# Patient Record
Sex: Male | Born: 1960 | Race: White | Hispanic: No | Smoking: Never smoker
Health system: Southern US, Community
[De-identification: ages and names within clinical notes are randomized; demographics above are authoritative.]

## PROBLEM LIST (undated history)

## (undated) DIAGNOSIS — N400 Enlarged prostate without lower urinary tract symptoms: Secondary | ICD-10-CM

## (undated) HISTORY — PX: HERNIA REPAIR: SHX51

## (undated) HISTORY — PX: APPENDECTOMY: SHX54

---

## 1998-08-08 ENCOUNTER — Encounter: Payer: Self-pay | Admitting: Family Medicine

## 1998-08-08 ENCOUNTER — Ambulatory Visit (HOSPITAL_COMMUNITY): Admission: RE | Admit: 1998-08-08 | Discharge: 1998-08-08 | Payer: Self-pay | Admitting: Family Medicine

## 1999-01-19 ENCOUNTER — Encounter: Payer: Self-pay | Admitting: Family Medicine

## 1999-01-19 ENCOUNTER — Ambulatory Visit (HOSPITAL_COMMUNITY): Admission: RE | Admit: 1999-01-19 | Discharge: 1999-01-19 | Payer: Self-pay | Admitting: Family Medicine

## 2011-11-01 ENCOUNTER — Other Ambulatory Visit: Payer: Self-pay | Admitting: Family Medicine

## 2011-11-01 DIAGNOSIS — R2231 Localized swelling, mass and lump, right upper limb: Secondary | ICD-10-CM

## 2011-11-06 ENCOUNTER — Ambulatory Visit
Admission: RE | Admit: 2011-11-06 | Discharge: 2011-11-06 | Disposition: A | Discharge status: Home / Self Care | Payer: Self-pay | Source: Ambulatory Visit | Attending: Family Medicine | Admitting: Family Medicine

## 2011-11-06 ENCOUNTER — Other Ambulatory Visit: Payer: Self-pay | Admitting: Family Medicine

## 2011-11-06 DIAGNOSIS — R2231 Localized swelling, mass and lump, right upper limb: Secondary | ICD-10-CM

## 2011-11-06 MED ORDER — GADOBENATE DIMEGLUMINE 529 MG/ML IV SOLN
20.0000 mL | Freq: Once | INTRAVENOUS | Status: AC | PRN
  Administered 2011-11-06: 20 mL via INTRAVENOUS

## 2012-10-31 ENCOUNTER — Other Ambulatory Visit: Payer: Self-pay | Admitting: Urology

## 2012-10-31 DIAGNOSIS — D400 Neoplasm of uncertain behavior of prostate: Secondary | ICD-10-CM

## 2012-12-01 ENCOUNTER — Ambulatory Visit (HOSPITAL_COMMUNITY)
Admission: RE | Admit: 2012-12-01 | Discharge: 2012-12-01 | Disposition: A | Discharge status: Home / Self Care | Payer: BC Managed Care – PPO | Source: Ambulatory Visit | Attending: Urology | Admitting: Urology

## 2012-12-01 ENCOUNTER — Other Ambulatory Visit: Payer: Self-pay | Admitting: Urology

## 2012-12-01 DIAGNOSIS — Z1389 Encounter for screening for other disorder: Secondary | ICD-10-CM

## 2012-12-01 DIAGNOSIS — D400 Neoplasm of uncertain behavior of prostate: Secondary | ICD-10-CM

## 2012-12-01 DIAGNOSIS — N400 Enlarged prostate without lower urinary tract symptoms: Secondary | ICD-10-CM | POA: Insufficient documentation

## 2012-12-01 DIAGNOSIS — R972 Elevated prostate specific antigen [PSA]: Secondary | ICD-10-CM | POA: Insufficient documentation

## 2012-12-01 MED ORDER — GADOBENATE DIMEGLUMINE 529 MG/ML IV SOLN
20.0000 mL | Freq: Once | INTRAVENOUS | Status: AC | PRN
  Administered 2012-12-01: 20 mL via INTRAVENOUS

## 2021-05-01 ENCOUNTER — Other Ambulatory Visit: Payer: Self-pay | Admitting: Urology

## 2021-05-01 DIAGNOSIS — R972 Elevated prostate specific antigen [PSA]: Secondary | ICD-10-CM

## 2021-05-10 ENCOUNTER — Emergency Department (HOSPITAL_BASED_OUTPATIENT_CLINIC_OR_DEPARTMENT_OTHER)
Admission: EM | Admit: 2021-05-10 | Discharge: 2021-05-10 | Disposition: A | Discharge status: Home / Self Care | Payer: Self-pay | Attending: Emergency Medicine | Admitting: Emergency Medicine

## 2021-05-10 ENCOUNTER — Encounter (HOSPITAL_BASED_OUTPATIENT_CLINIC_OR_DEPARTMENT_OTHER): Payer: Self-pay | Admitting: Emergency Medicine

## 2021-05-10 ENCOUNTER — Other Ambulatory Visit: Payer: Self-pay

## 2021-05-10 DIAGNOSIS — N390 Urinary tract infection, site not specified: Secondary | ICD-10-CM | POA: Insufficient documentation

## 2021-05-10 DIAGNOSIS — R339 Retention of urine, unspecified: Secondary | ICD-10-CM

## 2021-05-10 HISTORY — DX: Benign prostatic hyperplasia without lower urinary tract symptoms: N40.0

## 2021-05-10 LAB — URINALYSIS, ROUTINE W REFLEX MICROSCOPIC
Bilirubin Urine: NEGATIVE
Glucose, UA: NEGATIVE mg/dL
Ketones, ur: NEGATIVE mg/dL
Leukocytes,Ua: NEGATIVE
Nitrite: NEGATIVE
Protein, ur: NEGATIVE mg/dL
Specific Gravity, Urine: 1.015 (ref 1.005–1.030)
pH: 5.5 (ref 5.0–8.0)

## 2021-05-10 LAB — URINALYSIS, MICROSCOPIC (REFLEX)

## 2021-05-10 NOTE — Discharge Instructions (Addendum)
1.  Call Dr. Jethro Poling office tomorrow morning to schedule follow-up as soon as possible.

## 2021-05-10 NOTE — ED Notes (Addendum)
Pt discharged to home. Discharge instructions have been discussed with patient and/or family members. Pt verbally acknowledges understanding d/c instructions, and endorses comprehension to checkout at registration before leaving.  Pt changed to leg bag for comfort at d/c

## 2021-05-10 NOTE — ED Triage Notes (Signed)
Pt reports unable to urinate since sometime late last night; sts he usually has urgency and cannot hold urine

## 2021-05-10 NOTE — ED Provider Notes (Signed)
[facility] DEPART[facility] Provider Note   CSN: [id number] Arrival date & time: 05/10/21  1131     History Chief Complaint  Patient presents with   Urinary Retention    Leonard Cooley is a 60 y.o. male.  HPI Patient has known history of BPH.  He sees Dr. Jeffie Pollock.  Patient reports that he is due for a prostate MRI.  He reports he has had prior biopsies.  No prior surgeries.  Starting last night at about 11pm his urine stream started decreasing.  Overnight he could not urinate.  He started develop a lot of pain and pressure in the suprapubic area.  He was not seeing any blood or clot prior to this episode he denies he was having pain burning urgency.  He was not having flank pain nausea vomiting or fever.    Past Medical History:  Diagnosis Date   BPH (benign prostatic hyperplasia)     There are no problems to display for this patient.   Past Surgical History:  Procedure Laterality Date   APPENDECTOMY     HERNIA REPAIR         No family history on file.  Social History   Tobacco Use   Smoking status: Never   Smokeless tobacco: Never  Substance Use Topics   Alcohol use: Not Currently   Drug use: Never    Home Medications Prior to Admission medications   Not on File    Allergies    Patient has no known allergies.  Review of Systems   Review of Systems 10 systems reviewed negative except as per HPI Physical Exam Updated Vital Signs BP 123/85 (BP Location: Right Arm)   Pulse (!) 59   Temp (!) 97.1 F (36.2 C) (Oral)   Resp 18   Ht 6\' 1"  (1.854 m)   Wt 104.3 kg   SpO2 100%   BMI 30.34 kg/m   Physical Exam Constitutional:      Appearance: Normal appearance.  HENT:     Mouth/Throat:     Pharynx: Oropharynx is clear.  Eyes:     Extraocular Movements: Extraocular movements intact.  Cardiovascular:     Rate and Rhythm: Normal rate and regular rhythm.  Pulmonary:     Effort: Pulmonary effort is normal.     Breath sounds: Normal  breath sounds.  Abdominal:     Comments: Patient examined after Foley catheter insertion.  Abdomen is soft and nontender.  Genitourinary:    Comments: Foley catheter in place.  Penis normal without any bleeding or scrotal swelling. Musculoskeletal:        General: Normal range of motion.  Skin:    General: Skin is warm and dry.  Neurological:     General: No focal deficit present.     Mental Status: He is alert and oriented to person, place, and time.     Coordination: Coordination normal.    ED Results / Procedures / Treatments   Labs (all labs ordered are listed, but only abnormal results are displayed) Labs Reviewed  URINALYSIS, ROUTINE W REFLEX MICROSCOPIC - Abnormal; Notable for the following components:      Result Value   Hgb urine dipstick MODERATE (*)    All other components within normal limits  URINALYSIS, MICROSCOPIC (REFLEX) - Abnormal; Notable for the following components:   Bacteria, UA RARE (*)    All other components within normal limits  URINE CULTURE    EKG None  Radiology No results found.  Procedures  Procedures   Medications Ordered in ED Medications - No data to display  ED Course  I have reviewed the triage vital signs and the nursing notes.  Pertinent labs & imaging results that were available during my care of the patient were reviewed by me and considered in my medical decision making (see chart for details).    MDM Rules/Calculators/A&P                           Patient presents as outlined.  He had urinary retension of 1000 mL.  Much more comfortable after Foley catheter placement.  No signs of UTI at this time.  Patient has known BPH is likely etiology.  Patient will follow-up with urology ASAP. Final Clinical Impression(s) / ED Diagnoses Final diagnoses:  Urinary retention    Rx / DC Orders ED Discharge Orders     None        Charlesetta Shanks, MD 05/10/21 1534

## 2021-05-12 LAB — URINE CULTURE: Culture: NO GROWTH

## 2021-05-24 ENCOUNTER — Other Ambulatory Visit: Payer: Self-pay

## 2021-05-26 ENCOUNTER — Encounter (HOSPITAL_BASED_OUTPATIENT_CLINIC_OR_DEPARTMENT_OTHER): Payer: Self-pay | Admitting: Emergency Medicine

## 2021-05-26 ENCOUNTER — Other Ambulatory Visit: Payer: Self-pay

## 2021-05-26 ENCOUNTER — Emergency Department (HOSPITAL_BASED_OUTPATIENT_CLINIC_OR_DEPARTMENT_OTHER)
Admission: EM | Admit: 2021-05-26 | Discharge: 2021-05-26 | Disposition: A | Discharge status: Home / Self Care | Payer: Self-pay | Attending: Emergency Medicine | Admitting: Emergency Medicine

## 2021-05-26 DIAGNOSIS — R339 Retention of urine, unspecified: Secondary | ICD-10-CM | POA: Insufficient documentation

## 2021-05-26 DIAGNOSIS — R1030 Lower abdominal pain, unspecified: Secondary | ICD-10-CM | POA: Insufficient documentation

## 2021-05-26 LAB — URINALYSIS, ROUTINE W REFLEX MICROSCOPIC
Bilirubin Urine: NEGATIVE
Glucose, UA: NEGATIVE mg/dL
Hgb urine dipstick: NEGATIVE
Ketones, ur: NEGATIVE mg/dL
Leukocytes,Ua: NEGATIVE
Nitrite: NEGATIVE
Protein, ur: NEGATIVE mg/dL
Specific Gravity, Urine: 1.02 (ref 1.005–1.030)
pH: 6 (ref 5.0–8.0)

## 2021-05-26 NOTE — ED Notes (Signed)
Pt switched to leg bag by Edison Nasuti NT. Education done by [employer], pt verbalizes understanding and follow up.

## 2021-05-26 NOTE — ED Triage Notes (Signed)
Pt states he has not been able to urinate since last night

## 2021-05-26 NOTE — Discharge Instructions (Signed)
You were seen today for urinary retention.  Follow-up with urology as planned on Monday.  Call regarding continuing antibiotics while Foley catheter is in place.  If you develop fevers or flank pain, you should be reevaluated.

## 2021-05-26 NOTE — ED Provider Notes (Addendum)
[facility] EM[facility]ENC[facility] DEPARTMENT Provider Note   CSN: [id number] Arrival date & time: 05/26/21  7829     History Chief Complaint  Patient presents with   Urinary Retention    Leonard Cooley is a 60 y.o. male.  HPI     This is a 60 year old male with a history of BPH who presents with urinary retention.  He was seen and evaluated approximately 2 weeks ago for the same and had a Foley catheter placed.  He followed up in the urology office and had his Foley catheter removed last Wednesday.  At that time he was started on antibiotic for infection.  He was started on Levaquin.  He has follow-up next Monday.  Patient states that he last urinated last night.  He has been unable to urinate this morning.  He has urge to urinate and discomfort in his lower abdomen and penis.  He has not had any fevers or back pain.  Past Medical History:  Diagnosis Date   BPH (benign prostatic hyperplasia)     There are no problems to display for this patient.   Past Surgical History:  Procedure Laterality Date   APPENDECTOMY     HERNIA REPAIR         History reviewed. No pertinent family history.  Social History   Tobacco Use   Smoking status: Never   Smokeless tobacco: Never  Substance Use Topics   Alcohol use: Not Currently   Drug use: Never    Home Medications Prior to Admission medications   Not on File    Allergies    Patient has no known allergies.  Review of Systems   Review of Systems  Constitutional:  Negative for fever.  Respiratory:  Negative for shortness of breath.   Cardiovascular:  Negative for chest pain.  Gastrointestinal:  Positive for abdominal pain.  Genitourinary:  Positive for difficulty urinating and penile pain. Negative for hematuria.  All other systems reviewed and are negative.  Physical Exam Updated Vital Signs BP 131/83   Pulse 98   Temp 97.7 F (36.5 C) (Oral)   Resp 18   Ht 1.854 m (6\' 1" )   Wt 104.3 kg   SpO2 100%   BMI  30.34 kg/m   Physical Exam Vitals and nursing note reviewed.  Constitutional:      Appearance: He is well-developed.  HENT:     Head: Normocephalic and atraumatic.     Mouth/Throat:     Mouth: Mucous membranes are moist.  Eyes:     Pupils: Pupils are equal, round, and reactive to light.  Cardiovascular:     Rate and Rhythm: Normal rate and regular rhythm.  Pulmonary:     Effort: Pulmonary effort is normal. No respiratory distress.  Abdominal:     Palpations: Abdomen is soft.     Tenderness: There is abdominal tenderness. There is no rebound.     Comments: Suprapubic tenderness to palpation and fullness, no rebound or guarding  Musculoskeletal:     Cervical back: Neck supple.     Right lower leg: No edema.     Left lower leg: No edema.  Lymphadenopathy:     Cervical: No cervical adenopathy.  Skin:    General: Skin is warm and dry.  Neurological:     Mental Status: He is alert and oriented to person, place, and time.  Psychiatric:        Mood and Affect: Mood normal.    ED Results / Procedures /  Treatments   Labs (all labs ordered are listed, but only abnormal results are displayed) Labs Reviewed  URINE CULTURE  URINALYSIS, ROUTINE W REFLEX MICROSCOPIC    EKG None  Radiology No results found.  Procedures Procedures   Medications Ordered in ED Medications - No data to display  ED Course  I have reviewed the triage vital signs and the nursing notes.  Pertinent labs & imaging results that were available during my care of the patient were reviewed by me and considered in my medical decision making (see chart for details).    MDM Rules/Calculators/A&P                           Patient presents with likely acute retention.  He is nontoxic-appearing.  He does have tenderness.  He is currently being treated for UTI.  He is not septic appearing and is afebrile.  Foley catheter to be placed.  We will repeat urinalysis.  Patient signed out to oncoming provider.   Will need urology follow-up which he already has.  Recommend he contact urology regarding duration of antibiotic therapy.  7:21 AM Greater than 1400 output urine.  Urinalysis pending.  Signed out to oncoming provider.   Final Clinical Impression(s) / ED Diagnoses Final diagnoses:  Urinary retention    Rx / DC Orders ED Discharge Orders     None        Davell Beckstead, Barbette Hair, MD 05/26/21 0700    Merryl Hacker, MD 05/26/21 [phone]

## 2021-05-26 NOTE — ED Provider Notes (Signed)
Patient with urinary retention.  Urine Foley has been placed.  History of the same.  Currently on antibiotics.  Urinalysis negative for infection.  Discharged in good condition.  Recommend follow-up with primary care doctor/UROLOGY.  This chart was dictated using voice recognition software.  Despite best efforts to proofread,  errors can occur which can change the documentation meaning.    Lennice Sites, DO 05/26/21 [phone]

## 2021-05-27 LAB — URINE CULTURE: Culture: NO GROWTH

## 2021-06-13 ENCOUNTER — Other Ambulatory Visit: Payer: Self-pay

## 2021-06-13 ENCOUNTER — Ambulatory Visit
Admission: RE | Admit: 2021-06-13 | Discharge: 2021-06-13 | Disposition: A | Discharge status: Home / Self Care | Payer: Self-pay | Source: Ambulatory Visit | Attending: Urology | Admitting: Urology

## 2021-06-13 DIAGNOSIS — R972 Elevated prostate specific antigen [PSA]: Secondary | ICD-10-CM

## 2021-06-13 MED ORDER — GADOBENATE DIMEGLUMINE 529 MG/ML IV SOLN
20.0000 mL | Freq: Once | INTRAVENOUS | Status: AC | PRN
  Administered 2021-06-13: 20 mL via INTRAVENOUS

## 2022-09-21 IMAGING — MR MR PROSTATE WO/W CM
13 of 14 series · 47 of 48 positions shown · IV contrast (multihance)
Comparison: 12/01/2012

CLINICAL DATA: Elevated PSA.

EXAM:
MR PROSTATE WITHOUT AND WITH CONTRAST
TECHNIQUE: Multiplanar multisequence MRI images were obtained of the pelvis
centered about the prostate. Pre and post contrast images were
obtained.
CONTRAST:  20mL MULTIHANCE GADOBENATE DIMEGLUMINE 529 MG/ML IV SOLN

[Series 2: new loc · axial · 6.0mm · 0.88mm/px · 1 of 17 slices shown]
[im 1/17]
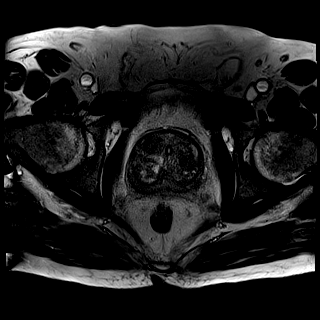

[Series 3: T2 · coronal · 3.0mm · 0.56mm/px · 1 of 23 slices shown (1 of 3)]
[im 1/23]
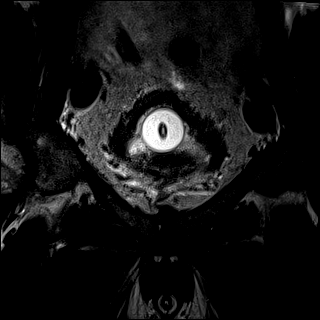

[Series 4: T1 · axial · 5.0mm · 1.25mm/px · 1 of 80 slices shown]
[im 1/80]
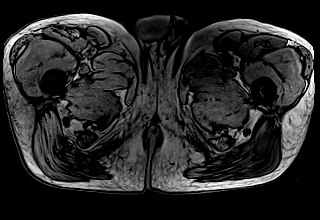

[Series 5: DWI · axial · 3.0mm · 1.75mm/px · 1 of 90 slices shown (1 of 3)]
[im 1/90]
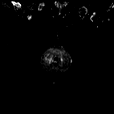

[Series 6: DWI · axial · 3.0mm · 1.75mm/px · 1 of 30 slices shown (2 of 3)]
[im 1/30]
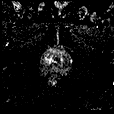

[Series 7: DWI · axial · 3.0mm · 1.75mm/px · 1 of 30 slices shown (3 of 3)]
[im 1/30]
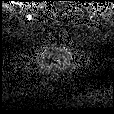

[Series 8: T2 · axial · 3.0mm · 0.56mm/px · 1 of 30 slices shown (2 of 3)]
[im 1/30]
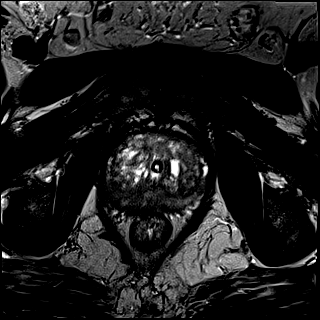

[Series 9: T2 · axial · 1.0mm · 1.04mm/px · 1 of 72 slices shown (3 of 3)]
[im 1/72]
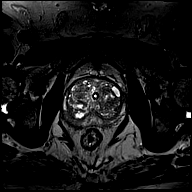

[Series 10: pre t1_twist_tra_dyn · axial · non-contrast · 3.5mm · 0.83mm/px · 1 of 24 slices shown]
[im 1/24]
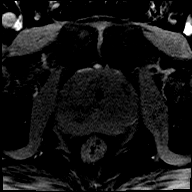

[Series 11: post t1_twist_tra_dyn-copy center · axial · non-contrast · 3.5mm · 0.83mm/px · z∈[-113,-33]mm · 17 of 720 slices shown]
[im 1/720]
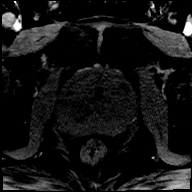
[im 45/720]
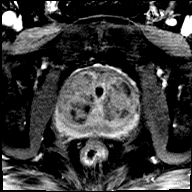
[im 90/720]
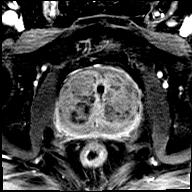
[im 135/720]
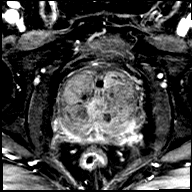
[im 180/720]
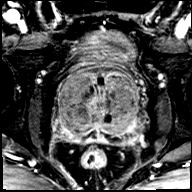
[im 225/720]
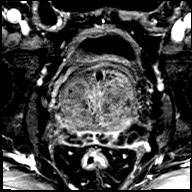
[im 270/720]
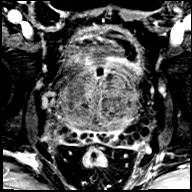
[im 315/720]
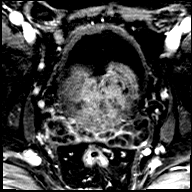
[im 360/720]
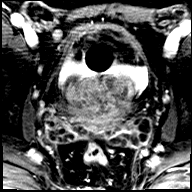
[im 405/720]
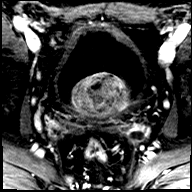
[im 450/720]
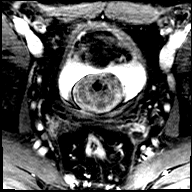
[im 495/720]
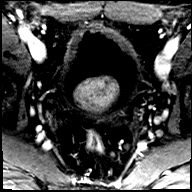
[im 540/720]
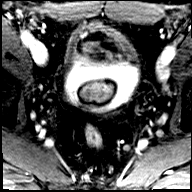
[im 585/720]
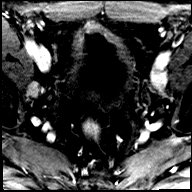
[im 630/720]
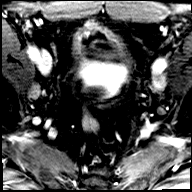
[im 675/720]
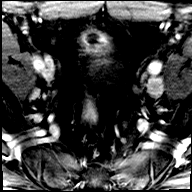
[im 720/720]
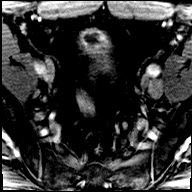

[Series 12: post t1_twist_tra_dyn-copy cent_sub · axial · 3.5mm · 0.83mm/px · z∈[-113,-33]mm · 17 of 696 slices shown]
[im 1/696]
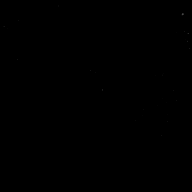
[im 44/696]
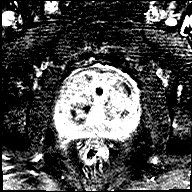
[im 87/696]
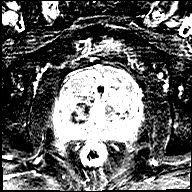
[im 131/696]
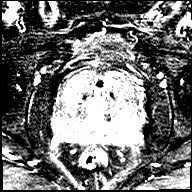
[im 174/696]
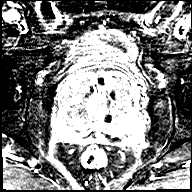
[im 218/696]
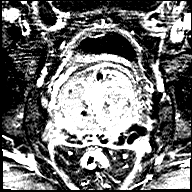
[im 261/696]
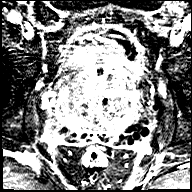
[im 305/696]
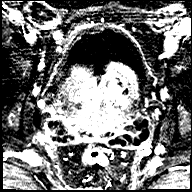
[im 348/696]
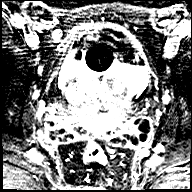
[im 391/696]
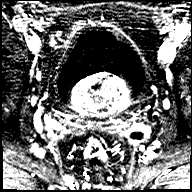
[im 435/696]
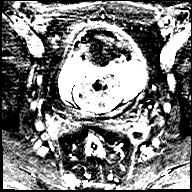
[im 478/696]
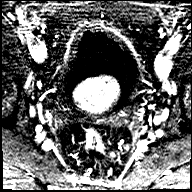
[im 522/696]
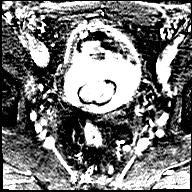
[im 565/696]
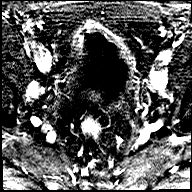
[im 609/696]
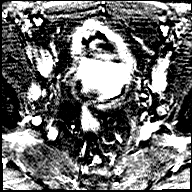
[im 652/696]
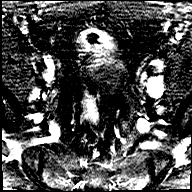
[im 696/696]
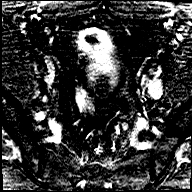

[Series 13: t1_vibe_dixon_tra_f · axial · 2.5mm · 0.91mm/px · z∈[-191,+7]mm · 2 of 80 slices shown]
[im 1/80]
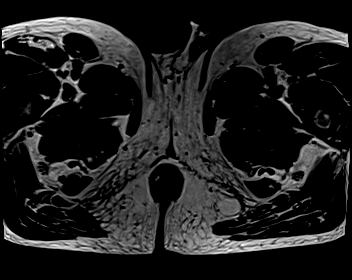
[im 80/80]
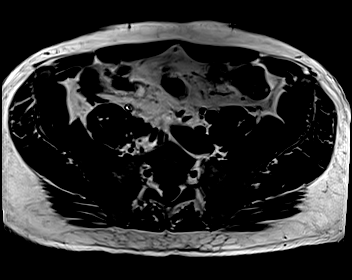

[Series 14: t1_vibe_dixon_tra_w · axial · 2.5mm · 0.91mm/px · z∈[-191,+7]mm · 2 of 80 slices shown]
[im 1/80]
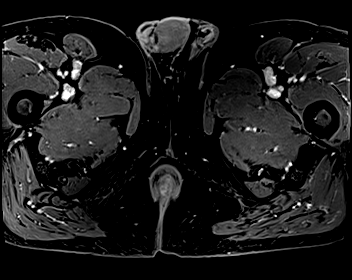
[im 80/80]
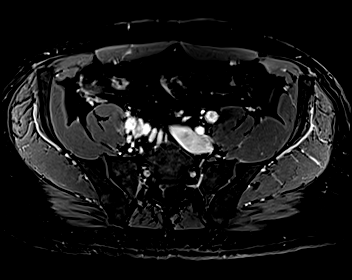

[47 of 48 positions shown; findings below may reference images not displayed]

FINDINGS: Prostate:

-- Peripheral Zone: Linear/wedge shaped hypointensities are noted on
ADC; however, no focal ADC hypointense or high b-value DWI
hyperintense nodules are identified.

-- Transition/Central Zone: Moderately enlarged with encapsulated
BPH nodules noted. Prominent median lobe hypertrophy is seen
indenting the bladder base. No suspicious nodules are identified on
T2-weighted or diffusion sequences.

-- Measurements/Volume:  9.4 by 5.4 x 6.7 cm (volume = 180 cm^3)

Transcapsular spread:  Absent

Seminal vesicle involvement:  Absent

Neurovascular bundle involvement:  Absent

Pelvic adenopathy: 7 mm right pelvic sidewall lymph node is noted.
No pathologically enlarged lymph nodes identified.

Bone metastasis: None visualized

Other:  Foley catheter seen within the urinary bladder.
IMPRESSION: No radiographic evidence of high-grade prostate carcinoma. PI-RADS 2
(v.2.1): Low (clinically significant cancer unlikely)
